# Patient Record
Sex: Male | Born: 1935 | Race: White | Hispanic: No | Smoking: Current every day smoker
Health system: Southern US, Community
[De-identification: ages and names within clinical notes are randomized; demographics above are authoritative.]

## PROBLEM LIST (undated history)

## (undated) DIAGNOSIS — R9430 Abnormal result of cardiovascular function study, unspecified: Secondary | ICD-10-CM

## (undated) DIAGNOSIS — J189 Pneumonia, unspecified organism: Secondary | ICD-10-CM

## (undated) DIAGNOSIS — I10 Essential (primary) hypertension: Secondary | ICD-10-CM

## (undated) DIAGNOSIS — I447 Left bundle-branch block, unspecified: Secondary | ICD-10-CM

## (undated) DIAGNOSIS — M545 Low back pain, unspecified: Secondary | ICD-10-CM

## (undated) DIAGNOSIS — F17200 Nicotine dependence, unspecified, uncomplicated: Secondary | ICD-10-CM

## (undated) DIAGNOSIS — I509 Heart failure, unspecified: Secondary | ICD-10-CM

## (undated) DIAGNOSIS — G8929 Other chronic pain: Secondary | ICD-10-CM

## (undated) DIAGNOSIS — J449 Chronic obstructive pulmonary disease, unspecified: Secondary | ICD-10-CM

## (undated) DIAGNOSIS — J4489 Other specified chronic obstructive pulmonary disease: Secondary | ICD-10-CM

## (undated) DIAGNOSIS — E785 Hyperlipidemia, unspecified: Secondary | ICD-10-CM

## (undated) DIAGNOSIS — IMO0002 Reserved for concepts with insufficient information to code with codable children: Secondary | ICD-10-CM

## (undated) DIAGNOSIS — N400 Enlarged prostate without lower urinary tract symptoms: Secondary | ICD-10-CM

## (undated) DIAGNOSIS — Z01810 Encounter for preprocedural cardiovascular examination: Secondary | ICD-10-CM

## (undated) DIAGNOSIS — D3A00 Benign carcinoid tumor of unspecified site: Secondary | ICD-10-CM

## (undated) HISTORY — DX: Chronic obstructive pulmonary disease, unspecified: J44.9

## (undated) HISTORY — DX: Nicotine dependence, unspecified, uncomplicated: F17.200

## (undated) HISTORY — DX: Abnormal result of cardiovascular function study, unspecified: R94.30

## (undated) HISTORY — DX: Pneumonia, unspecified organism: J18.9

## (undated) HISTORY — DX: Hyperlipidemia, unspecified: E78.5

## (undated) HISTORY — DX: Low back pain: M54.5

## (undated) HISTORY — DX: Left bundle-branch block, unspecified: I44.7

## (undated) HISTORY — DX: Reserved for concepts with insufficient information to code with codable children: IMO0002

## (undated) HISTORY — PX: OTHER SURGICAL HISTORY: SHX169

## (undated) HISTORY — DX: Other specified chronic obstructive pulmonary disease: J44.89

## (undated) HISTORY — DX: Other chronic pain: G89.29

## (undated) HISTORY — DX: Essential (primary) hypertension: I10

## (undated) HISTORY — DX: Low back pain, unspecified: M54.50

## (undated) HISTORY — DX: Encounter for preprocedural cardiovascular examination: Z01.810

## (undated) HISTORY — DX: Benign prostatic hyperplasia without lower urinary tract symptoms: N40.0

## (undated) HISTORY — DX: Benign carcinoid tumor of unspecified site: D3A.00

---

## 2006-07-07 ENCOUNTER — Ambulatory Visit: Payer: Self-pay | Admitting: Internal Medicine

## 2009-04-05 ENCOUNTER — Ambulatory Visit: Payer: Self-pay | Admitting: Cardiology

## 2011-04-12 DIAGNOSIS — I2510 Atherosclerotic heart disease of native coronary artery without angina pectoris: Secondary | ICD-10-CM

## 2011-06-29 ENCOUNTER — Encounter: Payer: Self-pay | Admitting: Cardiology

## 2011-07-01 ENCOUNTER — Ambulatory Visit (INDEPENDENT_AMBULATORY_CARE_PROVIDER_SITE_OTHER): Payer: 59 | Admitting: Cardiology

## 2011-07-01 ENCOUNTER — Encounter: Payer: Self-pay | Admitting: Cardiology

## 2011-07-01 DIAGNOSIS — Z01810 Encounter for preprocedural cardiovascular examination: Secondary | ICD-10-CM

## 2011-07-01 DIAGNOSIS — D3A00 Benign carcinoid tumor of unspecified site: Secondary | ICD-10-CM | POA: Insufficient documentation

## 2011-07-01 DIAGNOSIS — N400 Enlarged prostate without lower urinary tract symptoms: Secondary | ICD-10-CM | POA: Insufficient documentation

## 2011-07-01 DIAGNOSIS — I10 Essential (primary) hypertension: Secondary | ICD-10-CM

## 2011-07-01 DIAGNOSIS — J449 Chronic obstructive pulmonary disease, unspecified: Secondary | ICD-10-CM

## 2011-07-01 DIAGNOSIS — J189 Pneumonia, unspecified organism: Secondary | ICD-10-CM | POA: Insufficient documentation

## 2011-07-01 DIAGNOSIS — I447 Left bundle-branch block, unspecified: Secondary | ICD-10-CM

## 2011-07-01 DIAGNOSIS — J4489 Other specified chronic obstructive pulmonary disease: Secondary | ICD-10-CM | POA: Insufficient documentation

## 2011-07-01 DIAGNOSIS — E785 Hyperlipidemia, unspecified: Secondary | ICD-10-CM | POA: Insufficient documentation

## 2011-07-01 DIAGNOSIS — F17200 Nicotine dependence, unspecified, uncomplicated: Secondary | ICD-10-CM | POA: Insufficient documentation

## 2011-07-01 DIAGNOSIS — R9430 Abnormal result of cardiovascular function study, unspecified: Secondary | ICD-10-CM | POA: Insufficient documentation

## 2011-07-01 NOTE — Assessment & Plan Note (Signed)
There is no proven coronary disease.  There is a left bundle branch block but the patient reports a normal nuclear scan in the past.  He will obtain this data from the [place].  He does not want to do another study because he felt poorly with the prior study.  I am comfortable if we know that he had no ischemia in the past.  He has good left ventricular function by technically difficult echo.  It is difficult to assess his overall exercise capacity because of his lung disease and his right ankle abnormality.  He is not having any chest pain or signs of congestive heart failure.  On the basis of all of this data he can be cleared for knee surgery with no further cardiac workup.

## 2011-07-01 NOTE — Assessment & Plan Note (Signed)
Blood pressure is controlled. No change in therapy. 

## 2011-07-01 NOTE — Assessment & Plan Note (Signed)
Because the patient has left bundle branch block it would be helpful to be sure he does not have ischemic heart disease.  He tells me that he has had a nuclear stress study at the [place] within approximately the past 5 years.  He was told that it was normal.  This is helpful as a baseline evaluation relative to his left bundle branch block.  We also know that his recent echo was difficult but seemed to show good LV function.

## 2011-07-01 NOTE — Patient Instructions (Signed)
   Follow up if needed only. Your physician recommends that you continue on your current medications as directed. Please refer to the Current Medication list given to you today.

## 2011-07-01 NOTE — Assessment & Plan Note (Signed)
I suspect that the patient's major  Surgical risk is his lung disease.  He did have a recent pneumonia.  He will need careful pulmonary followup.

## 2011-07-01 NOTE — Progress Notes (Signed)
HPI Patient is seen today for new cardiac evaluation.  He has no prior documented coronary disease.  There is left bundle branch block.  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  He does have significant lung disease and he was recently hospitalized with pneumonia.  He has not been having any significant chest pain.  During his recent hospitalization an echocardiogram was done.  The study was technically very difficult.  However the data suggested that he had good left ventricular function.  The patient may be having knee surgery at some point and he needs cardiac clearance for this.  As part of the evaluation today I reviewed the patient's recent hospital day including the discharge summary, x-rays, echocardiogram.  I have also reviewed a note from Dr. Ubaldo Glassing.  I have updated this electronic medical record. Allergies  Allergen Reactions  . Ibuprofen   . Penicillins     Current Outpatient Prescriptions  Medication Sig Dispense Refill  . amLODipine (NORVASC) 10 MG tablet Take 10 mg by mouth daily.        Marland Kitchen aspirin 81 MG tablet Take 81 mg by mouth daily.        . cetirizine (ZYRTEC) 10 MG tablet Take 10 mg by mouth daily.        Marland Kitchen docusate sodium (COLACE) 100 MG capsule Take 100 mg by mouth 2 (two) times daily.        Marland Kitchen lisinopril (PRINIVIL,ZESTRIL) 20 MG tablet Take 20 mg by mouth daily.        . meloxicam (MOBIC) 15 MG tablet Take 15 mg by mouth daily.        . montelukast (SINGULAIR) 10 MG tablet Take 10 mg by mouth at bedtime.        . Multiple Vitamin (MULTIVITAMIN) tablet Take 1 tablet by mouth daily.        . rosuvastatin (CRESTOR) 10 MG tablet Take 10 mg by mouth daily.        Marland Kitchen senna (SENOKOT) 8.6 MG tablet Take 1 tablet by mouth daily.        Marland Kitchen zolpidem (AMBIEN) 10 MG tablet Take 10 mg by mouth at bedtime.          History   Social History  . Marital Status: Divorced    Spouse Name: N/A    Number of Children: N/A  .  Years of Education: N/A   Occupational History  . RETIRED    Social History Main Topics  . Smoking status: Former Smoker -- 1.0 packs/day    Types: Cigarettes    Quit date: 11/18/2010  . Smokeless tobacco: Not on file  . Alcohol Use: No  . Drug Use: No  . Sexually Active: Not on file   Other Topics Concern  . Not on file   Social History Narrative  . No narrative on file    No family history on file.  Past Medical History  Diagnosis Date  . Pneumonia     Hospitalization July, 2012  . COPD (chronic obstructive pulmonary disease)   . Hypertension   . BPH (benign prostatic hyperplasia)   . Carcinoid tumor     Lung  . Low back pain   . LBBB (left bundle branch block)     History in the past of a normal nuclear study  . Tobacco use disorder   . Dyslipidemia   . Chronic obstructive asthma, unspecified   . Other chronic pain   .  Ejection fraction     Technically limited echo, May, 2012, EF may be normal  . Preop cardiovascular exam     August, 2012, cardiac clearance for possible knee surgery    Past Surgical History  Procedure Date  . Right knee replacement   . Lung nodule removal     ROS  Patient denies fever, chills, headache, sweats, rash, change in vision, change in hearing, chest pain, nausea vomiting, urinary symptoms.  All other systems are reviewed and are negative.  PHYSICAL EXAM Patient is stable and here with a family member.  He walks with 2 canes.  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  He is here with a family member.  He is oriented to person time and place.  Affect is normal.  Head is atraumatic.  There is no xanthelasma.  There no carotid bruits.  There is no jugular venous distention.  Lungs are clear.  Respiratory effort is nonlabored at this time.  Cardiac exam reveals S1 and S2.  There are no clicks or significant murmurs.  The abdomen is soft.  There is no significant peripheral edema.  He wears a special shoe for his  right foot.Marland Kitchen  He has some ecchymoses related to his skin.  Filed Vitals:   07/01/11 1431  BP: 108/67  Pulse: 88  Resp: 16    EKG Is not done today.  I have reviewed the EKG from May 27, 2011.  There is sinus rhythm with left bundle branch block.  ASSESSMENT & PLAN

## 2011-07-25 ENCOUNTER — Telehealth: Payer: Self-pay | Admitting: *Deleted

## 2011-07-25 NOTE — Telephone Encounter (Signed)
Left message on voicemail that patient needs clearance for knee replacement, saw MD Myrtis Ser) on 8/13.

## 2011-07-26 NOTE — Telephone Encounter (Signed)
Pt wife left message on voicemail returning call.  Returned call to pt's wife and received voicemail. Left message to call back on voicemail.

## 2011-07-26 NOTE — Telephone Encounter (Signed)
Left message to call back on voice mail

## 2011-07-26 NOTE — Telephone Encounter (Signed)
Pt seen in office in August. Per that office note:  Preop cardiovascular exam - Willa Rough, MD 07/01/2011 3:07 PM Signed  There is no proven coronary disease. There is a left bundle branch block but the patient reports a normal nuclear scan in the past. He will obtain this data from the [place]. He does not want to do another study because he felt poorly with the prior study. I am comfortable if we know that he had no ischemia in the past. He has good left ventricular function by technically difficult echo. It is difficult to assess his overall exercise capacity because of his lung disease and his right ankle abnormality. He is not having any chest pain or signs of congestive heart failure. On the basis of all of this data he can be cleared for knee surgery with no further cardiac workup.  We do not have nuclear study from the [place]. Pt's wife states they will obtain this for Dr. Henrietta Hoover review. She is aware to contact our office if we can assist in obtain this study in anyway. Pt's wife verbalized understanding.

## 2012-05-18 ENCOUNTER — Encounter (INDEPENDENT_AMBULATORY_CARE_PROVIDER_SITE_OTHER): Payer: 59 | Admitting: Internal Medicine

## 2012-05-18 DIAGNOSIS — C3490 Malignant neoplasm of unspecified part of unspecified bronchus or lung: Secondary | ICD-10-CM

## 2012-05-18 DIAGNOSIS — M549 Dorsalgia, unspecified: Secondary | ICD-10-CM

## 2012-05-18 DIAGNOSIS — J449 Chronic obstructive pulmonary disease, unspecified: Secondary | ICD-10-CM

## 2012-05-18 DIAGNOSIS — M1990 Unspecified osteoarthritis, unspecified site: Secondary | ICD-10-CM

## 2012-11-06 ENCOUNTER — Encounter (INDEPENDENT_AMBULATORY_CARE_PROVIDER_SITE_OTHER): Payer: 59 | Admitting: Internal Medicine

## 2012-11-06 DIAGNOSIS — J449 Chronic obstructive pulmonary disease, unspecified: Secondary | ICD-10-CM

## 2012-11-06 DIAGNOSIS — M1990 Unspecified osteoarthritis, unspecified site: Secondary | ICD-10-CM

## 2012-11-06 DIAGNOSIS — G8929 Other chronic pain: Secondary | ICD-10-CM

## 2012-11-06 DIAGNOSIS — C3490 Malignant neoplasm of unspecified part of unspecified bronchus or lung: Secondary | ICD-10-CM

## 2013-05-17 ENCOUNTER — Encounter (INDEPENDENT_AMBULATORY_CARE_PROVIDER_SITE_OTHER): Payer: 59 | Admitting: Internal Medicine

## 2013-05-17 DIAGNOSIS — M2550 Pain in unspecified joint: Secondary | ICD-10-CM

## 2013-05-17 DIAGNOSIS — R5381 Other malaise: Secondary | ICD-10-CM

## 2013-05-17 DIAGNOSIS — M549 Dorsalgia, unspecified: Secondary | ICD-10-CM

## 2013-05-17 DIAGNOSIS — R5383 Other fatigue: Secondary | ICD-10-CM

## 2013-05-17 DIAGNOSIS — C7A090 Malignant carcinoid tumor of the bronchus and lung: Secondary | ICD-10-CM

## 2016-07-26 ENCOUNTER — Emergency Department (HOSPITAL_COMMUNITY): Payer: Medicare HMO

## 2016-07-26 ENCOUNTER — Emergency Department (HOSPITAL_COMMUNITY)
Admission: EM | Admit: 2016-07-26 | Discharge: 2016-07-27 | Discharge status: Against Medical Advice | Payer: Medicare HMO | Attending: Emergency Medicine | Admitting: Emergency Medicine

## 2016-07-26 ENCOUNTER — Encounter (HOSPITAL_COMMUNITY): Payer: Self-pay

## 2016-07-26 DIAGNOSIS — Z7982 Long term (current) use of aspirin: Secondary | ICD-10-CM | POA: Insufficient documentation

## 2016-07-26 DIAGNOSIS — J441 Chronic obstructive pulmonary disease with (acute) exacerbation: Secondary | ICD-10-CM

## 2016-07-26 DIAGNOSIS — F17210 Nicotine dependence, cigarettes, uncomplicated: Secondary | ICD-10-CM | POA: Insufficient documentation

## 2016-07-26 DIAGNOSIS — M7989 Other specified soft tissue disorders: Secondary | ICD-10-CM | POA: Diagnosis not present

## 2016-07-26 DIAGNOSIS — I509 Heart failure, unspecified: Secondary | ICD-10-CM | POA: Diagnosis not present

## 2016-07-26 DIAGNOSIS — R140 Abdominal distension (gaseous): Secondary | ICD-10-CM | POA: Diagnosis not present

## 2016-07-26 DIAGNOSIS — I110 Hypertensive heart disease with heart failure: Secondary | ICD-10-CM | POA: Diagnosis not present

## 2016-07-26 DIAGNOSIS — Z79899 Other long term (current) drug therapy: Secondary | ICD-10-CM | POA: Diagnosis not present

## 2016-07-26 DIAGNOSIS — R0602 Shortness of breath: Secondary | ICD-10-CM | POA: Diagnosis present

## 2016-07-26 HISTORY — DX: Heart failure, unspecified: I50.9

## 2016-07-26 LAB — CBC WITH DIFFERENTIAL/PLATELET
Basophils Absolute: 0 10*3/uL (ref 0.0–0.1)
Basophils Relative: 0 %
EOS PCT: 2 %
Eosinophils Absolute: 0.2 10*3/uL (ref 0.0–0.7)
HCT: 48.9 % (ref 39.0–52.0)
Hemoglobin: 16.2 g/dL (ref 13.0–17.0)
LYMPHS ABS: 1 10*3/uL (ref 0.7–4.0)
LYMPHS PCT: 13 %
MCH: 33.4 pg (ref 26.0–34.0)
MCHC: 33.1 g/dL (ref 30.0–36.0)
MCV: 100.8 fL — AB (ref 78.0–100.0)
MONO ABS: 0.6 10*3/uL (ref 0.1–1.0)
MONOS PCT: 8 %
Neutro Abs: 6.3 10*3/uL (ref 1.7–7.7)
Neutrophils Relative %: 77 %
PLATELETS: 103 10*3/uL — AB (ref 150–400)
RBC: 4.85 MIL/uL (ref 4.22–5.81)
RDW: 14 % (ref 11.5–15.5)
WBC: 8.2 10*3/uL (ref 4.0–10.5)

## 2016-07-26 LAB — BASIC METABOLIC PANEL
Anion gap: 10 (ref 5–15)
BUN: 23 mg/dL — AB (ref 6–20)
CALCIUM: 9.1 mg/dL (ref 8.9–10.3)
CO2: 28 mmol/L (ref 22–32)
Chloride: 103 mmol/L (ref 101–111)
Creatinine, Ser: 1.45 mg/dL — ABNORMAL HIGH (ref 0.61–1.24)
GFR calc Af Amer: 52 mL/min — ABNORMAL LOW (ref >60)
GFR, EST NON AFRICAN AMERICAN: 45 mL/min — AB (ref >60)
GLUCOSE: 136 mg/dL — AB (ref 65–99)
POTASSIUM: 4.4 mmol/L (ref 3.5–5.1)
Sodium: 141 mmol/L (ref 135–145)

## 2016-07-26 LAB — BRAIN NATRIURETIC PEPTIDE: B Natriuretic Peptide: 105 pg/mL — ABNORMAL HIGH (ref 0.0–100.0)

## 2016-07-26 LAB — TROPONIN I: Troponin I: <0.03 ng/mL (ref <0.03)

## 2016-07-26 MED ORDER — ALBUTEROL SULFATE (2.5 MG/3ML) 0.083% IN NEBU
5.0000 mg | INHALATION_SOLUTION | Freq: Once | RESPIRATORY_TRACT | Status: AC
  Administered 2016-07-26: 5 mg via RESPIRATORY_TRACT
  Filled 2016-07-26: qty 6

## 2016-07-26 MED ORDER — FUROSEMIDE 10 MG/ML IJ SOLN
60.0000 mg | Freq: Once | INTRAMUSCULAR | Status: AC
  Administered 2016-07-26: 60 mg via INTRAVENOUS
  Filled 2016-07-26: qty 6

## 2016-07-26 MED ORDER — IPRATROPIUM BROMIDE 0.02 % IN SOLN
0.5000 mg | Freq: Once | RESPIRATORY_TRACT | Status: AC
  Administered 2016-07-26: 0.5 mg via RESPIRATORY_TRACT
  Filled 2016-07-26: qty 2.5

## 2016-07-26 MED ORDER — NITROGLYCERIN 2 % TD OINT
1.0000 [in_us] | TOPICAL_OINTMENT | Freq: Once | TRANSDERMAL | Status: AC
  Administered 2016-07-26: 1 [in_us] via TOPICAL
  Filled 2016-07-26: qty 1

## 2016-07-26 MED ORDER — METHYLPREDNISOLONE SODIUM SUCC 125 MG IJ SOLR
125.0000 mg | Freq: Once | INTRAMUSCULAR | Status: AC
  Administered 2016-07-26: 125 mg via INTRAVENOUS
  Filled 2016-07-26: qty 2

## 2016-07-26 NOTE — ED Notes (Signed)
Pt returned from xray, pulse ox dropped to 83% RA with exertion, pt placed on oxygen at 2lpm via  with increase in oxygen level to 96%

## 2016-07-26 NOTE — ED Notes (Signed)
Pt states that he has been having problems with sob for the past 3 weeks, was started on Levaquin and prednisone with improvement in symptoms, but once the medications were finished the sob became worse, pt has distention noted to abd area and bilateral lower extremity swelling,

## 2016-07-26 NOTE — Progress Notes (Signed)
Patient took partial neb did not think he needed it all.

## 2016-07-26 NOTE — ED Triage Notes (Signed)
Patient sent from [facility] in [place] for worsening of CHF symptoms-Cough, shortness of breath, and swelling to lower extremities and abdominal area

## 2016-07-26 NOTE — ED Provider Notes (Signed)
[facility] DEPT Provider Note   CSN: [id number] Arrival date & time: 07/26/16  2137  By signing my name below, I, Gwenlyn Fudge, attest that this documentation has been prepared under the direction and in the presence of Rolland Porter, MD. Electronically Signed: Gwenlyn Fudge, ED Scribe. 07/26/16. 1:22 AM.  Time seen 23:05 PM   History   Chief Complaint Chief Complaint  Patient presents with  . Shortness of Breath   The history is provided by the patient, a relative and the spouse. No language interpreter was used.    HPI Comments: Melvin Hogan is a 81 y.o. male with PMHx of CHF who presents to the Emergency Department complaining of gradual onset and worsening shortness of breath onset 3 weeks ago.   Patient reports he has been having shortness of breath for the past 3 weeks. He denies cough, fever, or chest pain. He states he feels short of breath and has wheezing. He states he has a nebulizer and inhaler at home but does not feel like they help. He states he started having swelling of his lower extremities about 3 weeks ago and he feels like his abdomen started swelling and getting tight about a week ago. He states he has dyspnea on exertion. He has oxygen at home that he uses when necessary however he rarely uses it. He states about 3-1/2 weeks ago he was seen and put on antibiotics and steroids which helped briefly. He states he is taking a diuretic and states he still having good urinary output. He recently was seen at the [place] and had an echocardiogram that they are waiting to get the results of.  PCP Dr Sherrie Sport [facility]   Past Medical History:  Diagnosis Date  . BPH (benign prostatic hyperplasia)   . Carcinoid tumor    Lung  . CHF (congestive heart failure) ([place])   . Chronic obstructive asthma, unspecified   . COPD (chronic obstructive pulmonary disease) ([place])   . Dyslipidemia   . Ejection fraction    Technically limited echo, May, 2012, EF may be  normal  . Hypertension   . LBBB (left bundle branch block)    History in the past of a normal nuclear study  . Low back pain   . Other chronic pain   . Pneumonia    Hospitalization July, 2012  . Preop cardiovascular exam    August, 2012, cardiac clearance for possible knee surgery  . Tobacco use disorder     Patient Active Problem List   Diagnosis Date Noted  . Pneumonia   . COPD (chronic obstructive pulmonary disease) ([place])   . Hypertension   . BPH (benign prostatic hyperplasia)   . Carcinoid tumor   . Tobacco use disorder   . Dyslipidemia   . Chronic obstructive asthma, unspecified   . Ejection fraction   . Preop cardiovascular exam   . LBBB (left bundle branch block)     Past Surgical History:  Procedure Laterality Date  . LUNG NODULE REMOVAL    . RIGHT KNEE REPLACEMENT       Home Medications    Prior to Admission medications   Medication Sig Start Date End Date Taking? Authorizing Provider  amLODipine (NORVASC) 10 MG tablet Take 10 mg by mouth daily.      Historical Provider, MD  aspirin 81 MG tablet Take 81 mg by mouth daily.      Historical Provider, MD  cetirizine (ZYRTEC) 10 MG tablet Take 10 mg by mouth  daily.      Historical Provider, MD  docusate sodium (COLACE) 100 MG capsule Take 100 mg by mouth 2 (two) times daily.      Historical Provider, MD  lisinopril (PRINIVIL,ZESTRIL) 20 MG tablet Take 20 mg by mouth daily.      Historical Provider, MD  meloxicam (MOBIC) 15 MG tablet Take 15 mg by mouth daily.      Historical Provider, MD  montelukast (SINGULAIR) 10 MG tablet Take 10 mg by mouth at bedtime.      Historical Provider, MD  Multiple Vitamin (MULTIVITAMIN) tablet Take 1 tablet by mouth daily.      Historical Provider, MD  rosuvastatin (CRESTOR) 10 MG tablet Take 10 mg by mouth daily.      Historical Provider, MD  senna (SENOKOT) 8.6 MG tablet Take 1 tablet by mouth daily.      Historical Provider, MD  zolpidem (AMBIEN) 10 MG tablet Take 10 mg by mouth  at bedtime.      Historical Provider, MD    Family History History reviewed. No pertinent family history.  Social History Social History  Substance Use Topics  . Smoking status: Current Every Day Smoker    Packs/day: 1.00    Types: Cigarettes    Last attempt to quit: 11/18/2010  . Smokeless tobacco: Never Used  . Alcohol use No  lives at home Lives with spouse Uses oxygen 2 lpm Keys   Allergies   Ibuprofen; Penicillins; and Sulindac   Review of Systems Review of Systems  Constitutional: Negative for fever.  Respiratory: Positive for shortness of breath and wheezing. Negative for cough.   Cardiovascular: Positive for leg swelling. Negative for chest pain.  Gastrointestinal: Positive for abdominal distention.  All other systems reviewed and are negative.   Physical Exam Updated Vital Signs BP 148/71 (BP Location: Left Arm)   Pulse 78   Temp 97.7 F (36.5 C) (Oral)   Resp (!) 27   Ht '5\' 8"'$  (1.727 m)   Wt 230 lb (104.3 kg)   SpO2 94%   BMI 34.97 kg/m   Vital signs normal    Physical Exam  Constitutional: He is oriented to person, place, and time. He appears well-developed and well-nourished.  Non-toxic appearance. He does not appear ill. No distress.  HENT:  Head: Normocephalic and atraumatic.  Right Ear: External ear normal.  Left Ear: External ear normal.  Nose: Nose normal. No mucosal edema or rhinorrhea.  Mouth/Throat: Oropharynx is clear and moist and mucous membranes are normal. No dental abscesses or uvula swelling.  Eyes: Conjunctivae and EOM are normal. Pupils are equal, round, and reactive to light.  Neck: Normal range of motion and full passive range of motion without pain. Neck supple.  Cardiovascular: Normal rate, regular rhythm and normal heart sounds.  Exam reveals no gallop and no friction rub.   No murmur heard. Pulmonary/Chest: Effort normal. No respiratory distress. He has wheezes. He has no rhonchi. He has no rales. He exhibits no tenderness  and no crepitus.  Abdominal: Soft. Normal appearance and bowel sounds are normal. He exhibits distension. There is no tenderness. There is no rebound and no guarding.  Musculoskeletal: Normal range of motion. He exhibits edema. He exhibits no tenderness.  Moves all extremities well. He has 1+ pitting edema of his LE  Neurological: He is alert and oriented to person, place, and time. He has normal strength. No cranial nerve deficit.  Skin: Skin is warm, dry and intact. No rash noted. No erythema.  No pallor.  Psychiatric: He has a normal mood and affect. His speech is normal and behavior is normal. His mood appears not anxious.  Nursing note and vitals reviewed.   ED Treatments / Results  DIAGNOSTIC STUDIES: Oxygen Saturation is 95% on RA, adequate by my interpretation.     Labs (all labs ordered are listed, but only abnormal results are displayed) Results for orders placed or performed during the hospital encounter of 07/26/16  CBC with Differential  Result Value Ref Range   WBC 8.2 4.0 - 10.5 K/uL   RBC 4.85 4.22 - 5.81 MIL/uL   Hemoglobin 16.2 13.0 - 17.0 g/dL   HCT 48.9 39.0 - 52.0 %   MCV 100.8 (H) 78.0 - 100.0 fL   MCH 33.4 26.0 - 34.0 pg   MCHC 33.1 30.0 - 36.0 g/dL   RDW 14.0 11.5 - 15.5 %   Platelets 103 (L) 150 - 400 K/uL   Neutrophils Relative % 77 %   Neutro Abs 6.3 1.7 - 7.7 K/uL   Lymphocytes Relative 13 %   Lymphs Abs 1.0 0.7 - 4.0 K/uL   Monocytes Relative 8 %   Monocytes Absolute 0.6 0.1 - 1.0 K/uL   Eosinophils Relative 2 %   Eosinophils Absolute 0.2 0.0 - 0.7 K/uL   Basophils Relative 0 %   Basophils Absolute 0.0 0.0 - 0.1 K/uL  Basic metabolic panel  Result Value Ref Range   Sodium 141 135 - 145 mmol/L   Potassium 4.4 3.5 - 5.1 mmol/L   Chloride 103 101 - 111 mmol/L   CO2 28 22 - 32 mmol/L   Glucose, Bld 136 (H) 65 - 99 mg/dL   BUN 23 (H) 6 - 20 mg/dL   Creatinine, Ser 1.45 (H) 0.61 - 1.24 mg/dL   Calcium 9.1 8.9 - 10.3 mg/dL   GFR calc non Af Amer 45  (L) >60 mL/min   GFR calc Af Amer 52 (L) >60 mL/min   Anion gap 10 5 - 15  Troponin I  Result Value Ref Range   Troponin I <0.03 <0.03 ng/mL  Brain natriuretic peptide  Result Value Ref Range   B Natriuretic Peptide 105.0 (H) 0.0 - 100.0 pg/mL   Laboratory interpretation all normal except renal insufficiency    EKG  EKG Interpretation  Date/Time:  Friday July 26 2016 21:59:14 EDT Ventricular Rate:  70 PR Interval:  198 QRS Duration: 158 QT Interval:  454 QTC Calculation: 490 R Axis:   57 Text Interpretation:  Normal sinus rhythm Left bundle branch block No old tracing to compare Confirmed by Carrizo Hill  MD-I, Kayal Mula ([id number]) on 07/26/2016 11:16:47 PM       Radiology Dg Chest 2 View  Result Date: 07/26/2016 CLINICAL DATA:  Shortness of breath.  History of COPD and pneumonia. EXAM: CHEST  2 VIEW COMPARISON:  None. FINDINGS: Cardiomediastinal silhouette is normal. Diffuse mild interstitial prominence without pleural effusion or focal consolidation. Flattened hemidiaphragms. RIGHT perihilar granuloma. No pneumothorax. Biapical pleural thickening. Severe degenerative change of the RIGHT shoulder with loose bodies. IMPRESSION: COPD, no acute cardiopulmonary process. Electronically Signed   By: Elon Alas M.D.   On: 07/26/2016 22:51    Procedures Procedures (including critical care time)  Medications Ordered in ED Medications  albuterol (PROVENTIL) (2.5 MG/3ML) 0.083% nebulizer solution 5 mg (5 mg Nebulization Given 07/26/16 2238)  furosemide (LASIX) injection 60 mg (60 mg Intravenous Given 07/26/16 2335)  nitroGLYCERIN (NITROGLYN) 2 % ointment 1 inch (1 inch Topical Given 07/26/16 2339)  albuterol (PROVENTIL) (2.5 MG/3ML) 0.083% nebulizer solution 5 mg (5 mg Nebulization Given 07/26/16 2336)  ipratropium (ATROVENT) nebulizer solution 0.5 mg (0.5 mg Nebulization Given 07/26/16 2336)  methylPREDNISolone sodium succinate (SOLU-MEDROL) 125 mg/2 mL injection 125 mg (125 mg Intravenous Given  07/26/16 2335)  oxyCODONE-acetaminophen (PERCOCET/ROXICET) 5-325 MG per tablet 1 tablet (1 tablet Oral Given 07/27/16 0017)   Initial Impression / Assessment and Plan / ED Course  I have reviewed the triage vital signs and the nursing notes.  Pertinent labs & imaging results that were available during my care of the patient were reviewed by me and considered in my medical decision making (see chart for details).  Clinical Course  COORDINATION OF CARE: 11:17 PM Discussed treatment plan with pt at bedside which includes Breathing treatment, Nitroglycerin And IV Lasix and pt agreed to plan.  01:05 AM rechecked after second nebulizer, still has some wheezing, but less than before. He is having urine output. Per nurses notes, his oxygen drops to 84% with exertion and patient doesn't use his oxygen at home. Pt states he prefers to be discharged home instead of admitted, which is what I would prefer. Patient's wife and daughter were not in the room.  Nurses report patient left AMA. He had made it clear to me he did not want to be admitted. Patient has oxygen at home he can use but he doesn't use. He has inhalers at home. He does not have a pneumonia so antibiotics were not indicated. However he may have benefited  from another course of steroids.  Final Clinical Impressions(s) / ED Diagnoses   Final diagnoses:  COPD exacerbation ([place])    Pt let AMA  Rolland Porter, MD, FACEP   I personally performed the services described in this documentation, which was scribed in my presence. The recorded information has been reviewed and considered.  Rolland Porter, MD, Barbette Or, MD 07/27/16 [phone]

## 2016-07-27 MED ORDER — ALBUTEROL SULFATE (2.5 MG/3ML) 0.083% IN NEBU: 5.0000 mg | INHALATION_SOLUTION | Freq: Once | RESPIRATORY_TRACT | Status: DC

## 2016-07-27 MED ORDER — OXYCODONE-ACETAMINOPHEN 5-325 MG PO TABS
1.0000 | ORAL_TABLET | Freq: Once | ORAL | Status: AC
  Administered 2016-07-27: 1 via ORAL
  Filled 2016-07-27: qty 1

## 2016-07-27 MED ORDER — IPRATROPIUM BROMIDE 0.02 % IN SOLN: 0.5000 mg | Freq: Once | RESPIRATORY_TRACT | Status: DC

## 2016-07-27 NOTE — ED Notes (Signed)
Pt and family updated on plan of care,  

## 2016-07-27 NOTE — ED Notes (Signed)
MD at bedside. 

## 2016-07-27 NOTE — ED Notes (Signed)
Pt states that he is feeling better, requesting to go home,

## 2016-07-27 NOTE — Progress Notes (Signed)
Patient restarted on last neb by nurse,

## 2016-07-27 NOTE — ED Notes (Signed)
Pt signed out ama, states "I just can't sit on that bed anymore"   Pt agrees that he will return if needed. Pt appears comfortable, nad.

## 2016-07-27 NOTE — ED Notes (Signed)
Breathing treatment started,

## 2016-07-27 NOTE — ED Notes (Signed)
Pt c/o pain all over, states that he takes percocet's at home for his pain,

## 2017-01-31 IMAGING — DX DG CHEST 2V
2 series · 2 of 2 positions shown · non-contrast
Comparison: None.

CLINICAL DATA: Shortness of breath.  History of COPD and pneumonia.

EXAM:
CHEST  2 VIEW

[chest pa]
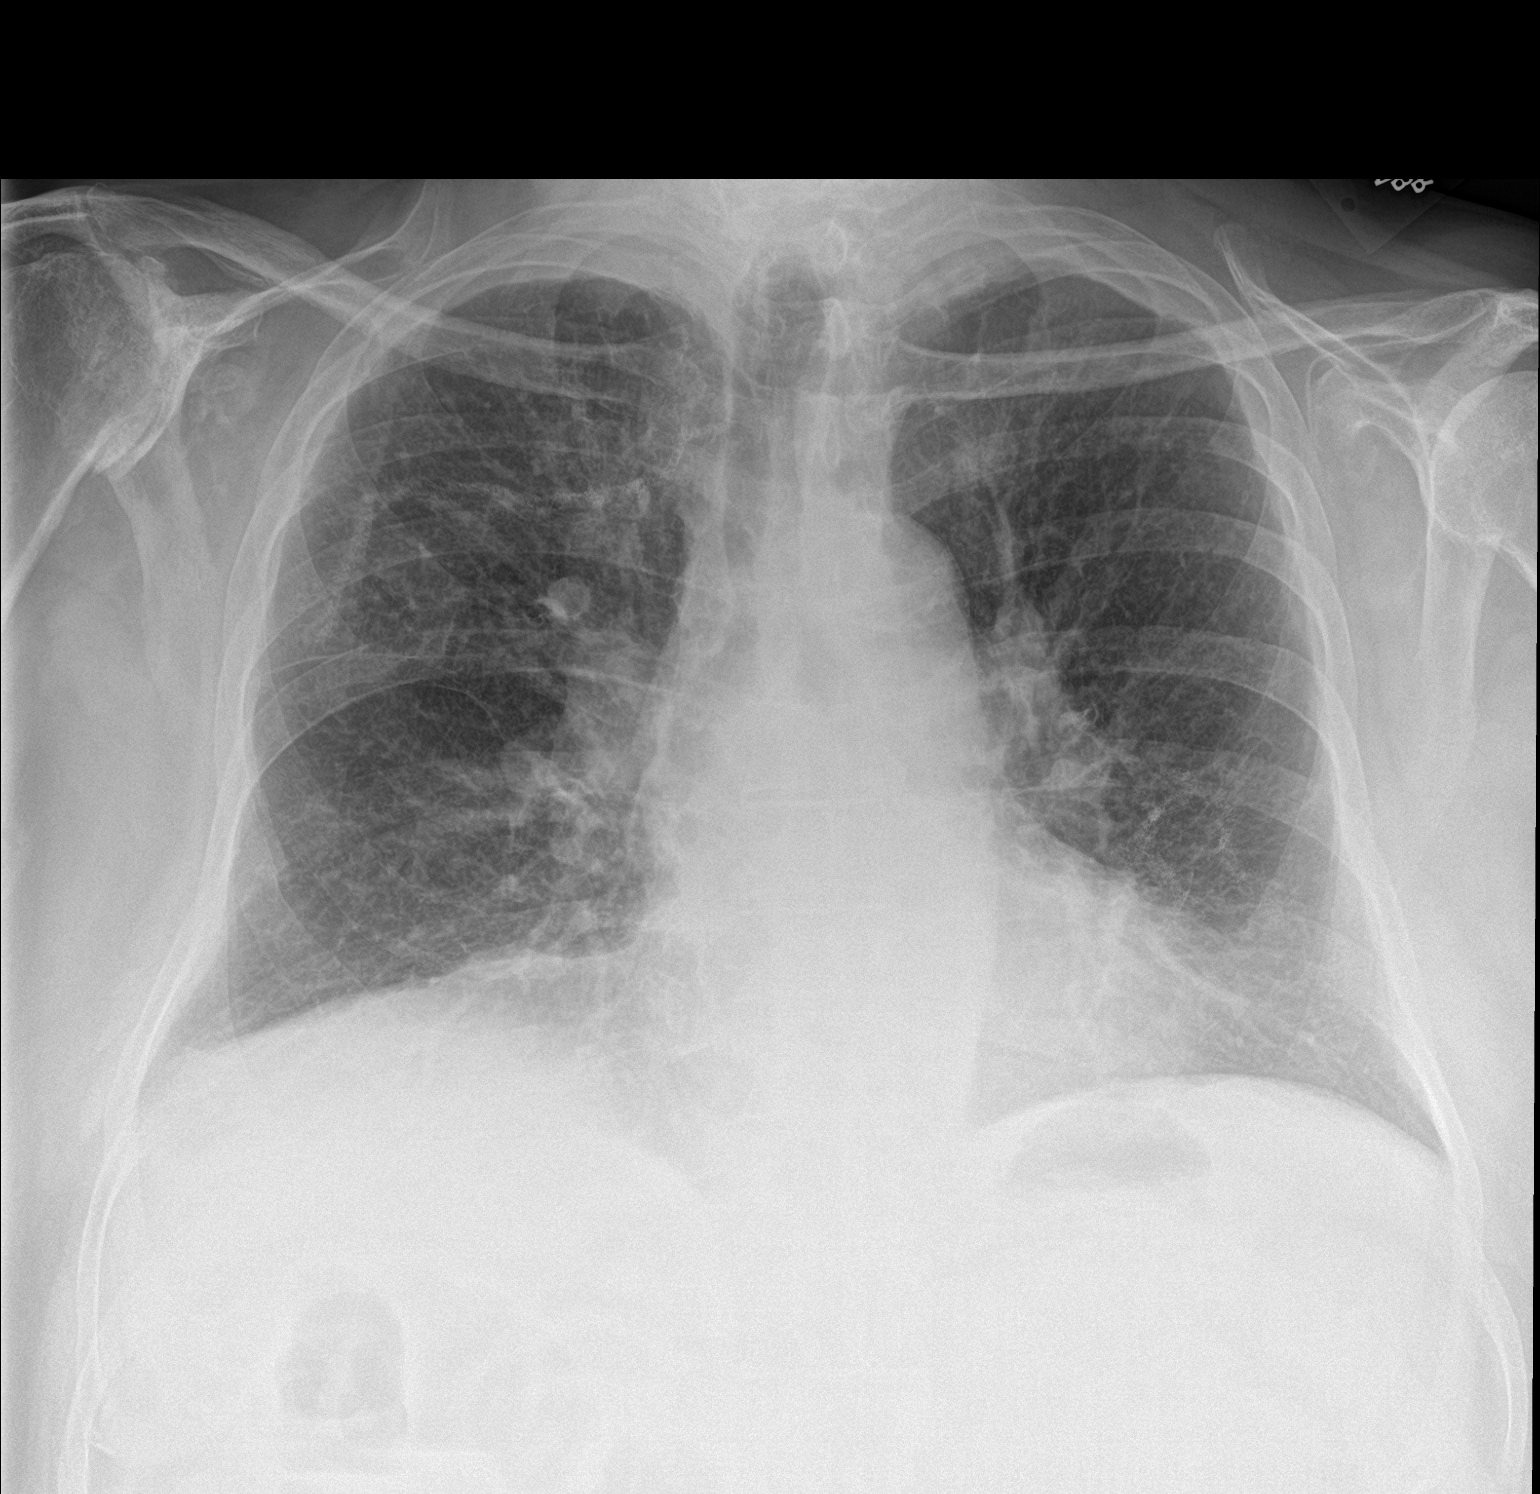

[chest lat]
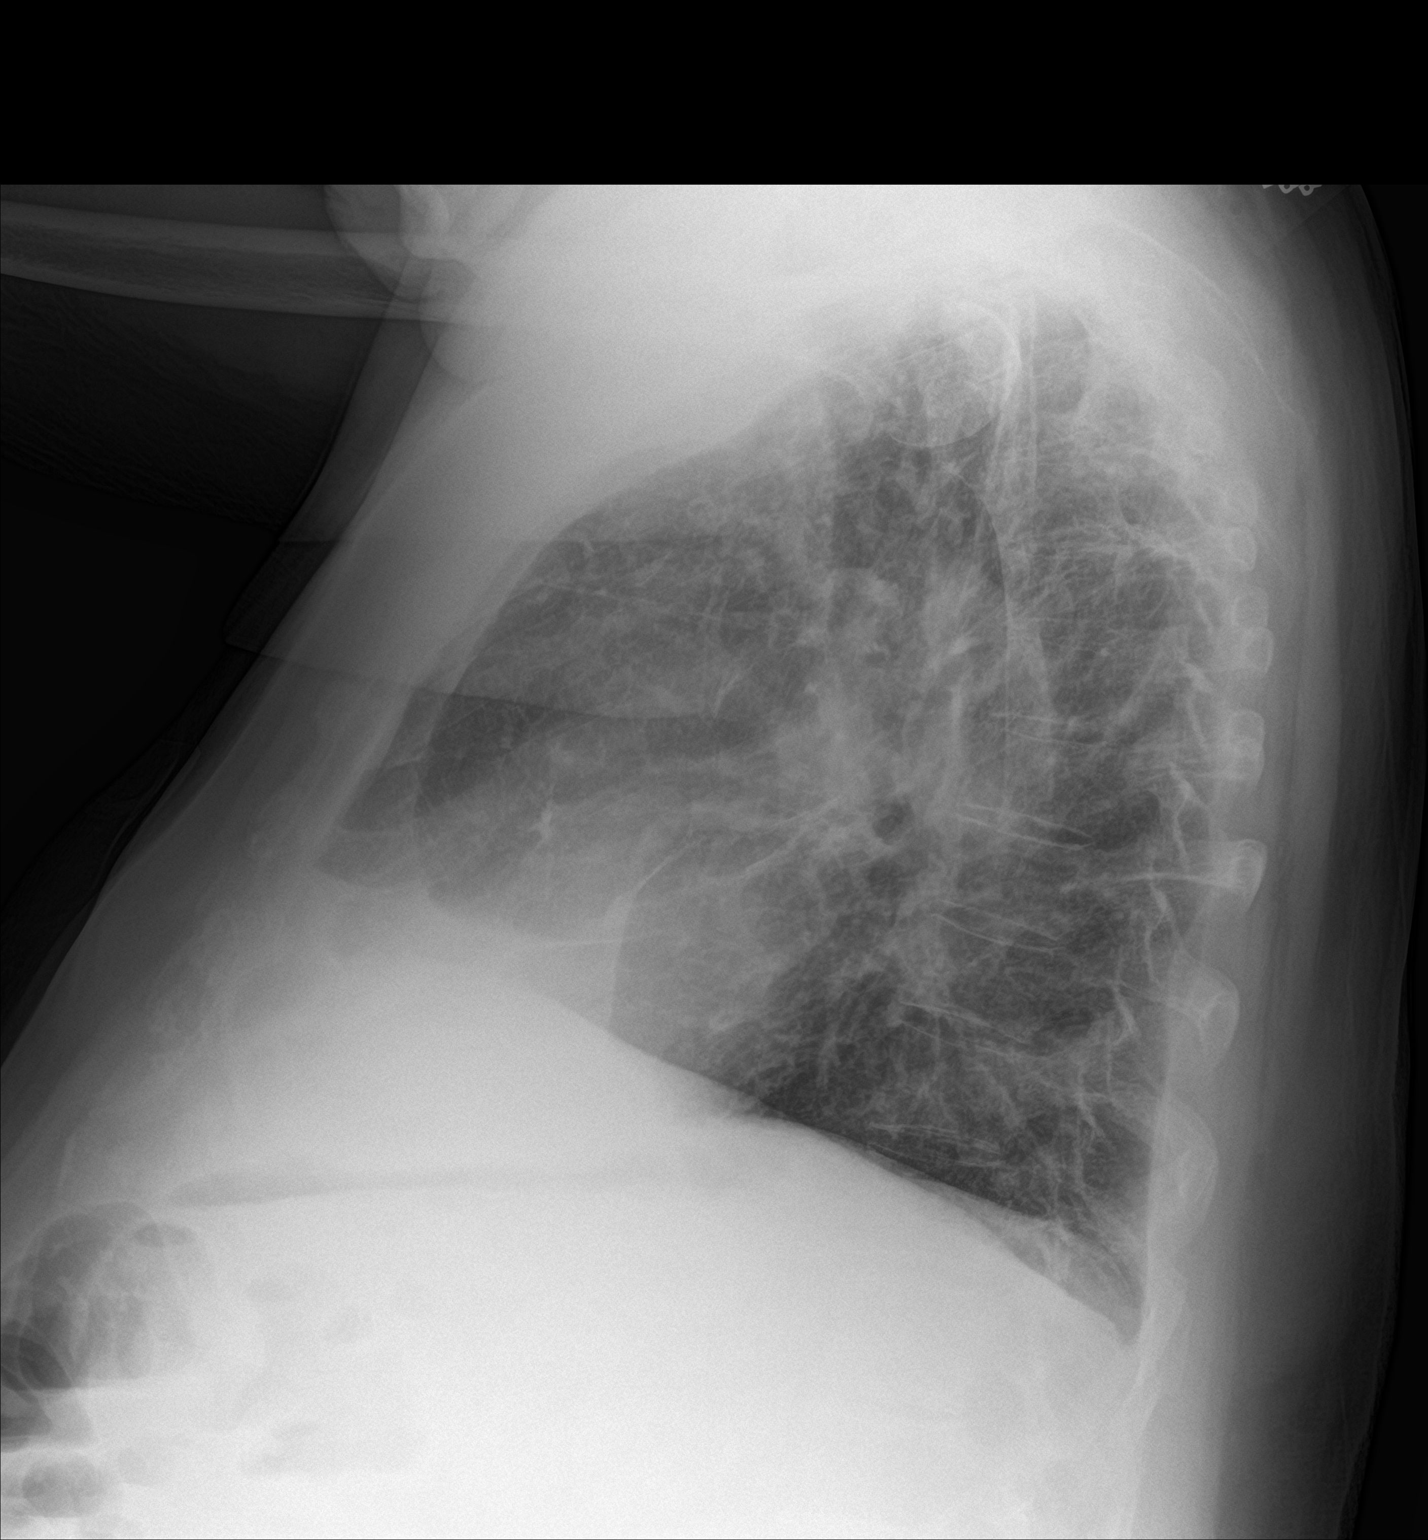

[2 of 2 positions shown; findings below may reference images not displayed]

FINDINGS: Cardiomediastinal silhouette is normal. Diffuse mild interstitial
prominence without pleural effusion or focal consolidation.
Flattened hemidiaphragms. RIGHT perihilar granuloma. No
pneumothorax. Biapical pleural thickening. Severe degenerative
change of the RIGHT shoulder with loose bodies.
IMPRESSION: COPD, no acute cardiopulmonary process.
